# Patient Record
Sex: Male | Born: 1935 | Race: White | Hispanic: No
Health system: Southern US, Community
[De-identification: ages and names within clinical notes are randomized; demographics above are authoritative.]

---

## 2007-11-30 ENCOUNTER — Encounter: Admission: RE | Admit: 2007-11-30 | Discharge: 2007-11-30 | Payer: Self-pay | Admitting: Internal Medicine

## 2009-04-02 IMAGING — CT CT ABDOMEN WO/W CM
2 of 5 series · 16 of 46 positions shown, 18 images · IV contrast (agent unspecified)
Comparison: None

CT ABDOMEN

CLINICAL DATA: Mid abdominal pain for several months, history
prior trauma with laceration kidney and fracture pelvis

CT ABDOMEN WITHOUT AND WITH CONTRAST
CT PELVIS WITH CONTRAST
TECHNIQUE: Multidetector CT imaging of the abdomen was performed
initially following the standard protocol before administration of
intravenous contrast.  Multidetector CT imaging of the abdomen and
pelvis was then performed following the standard protocol during
the bolus injection of intravenous contrast.
Contrast: 100 ml Ymnipaque-4II

[Series 3: abdomen w/ · axial · 0.92mm/px · z∈[-504,-99]mm · 13 of 93 slices shown, 15 images]
[im 6/93  soft-tissue]
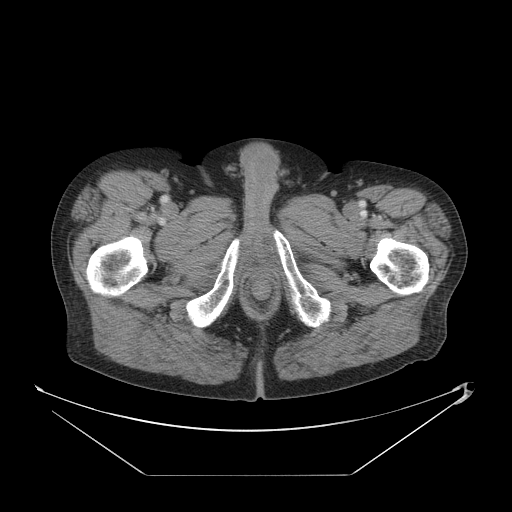
[im 6/93  bone]
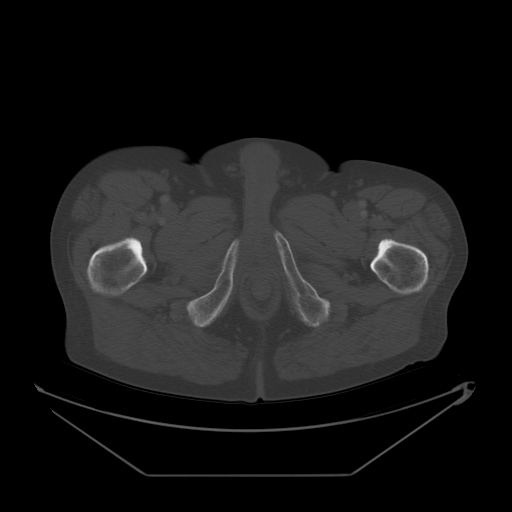
[im 12/93  soft-tissue]
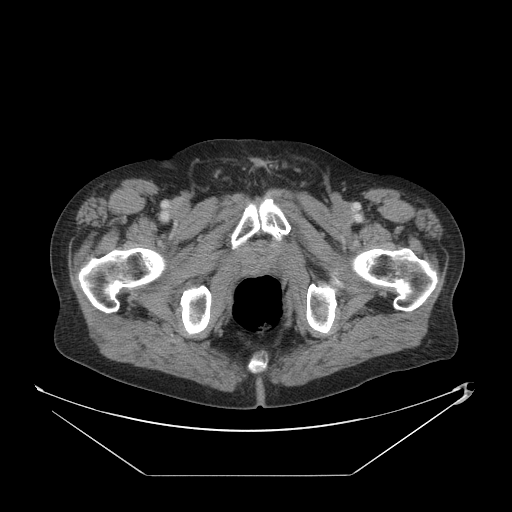
[im 18/93  soft-tissue]
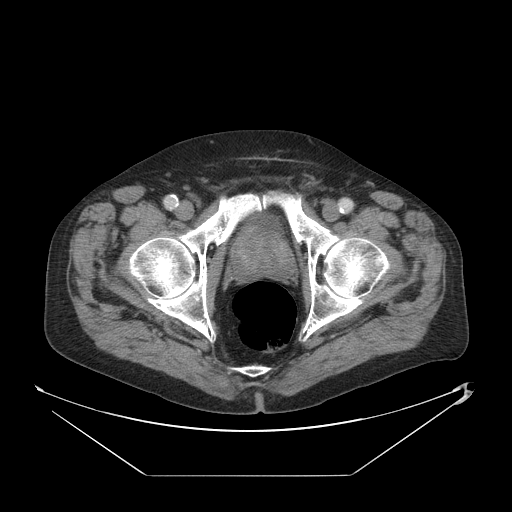
[im 29/93  soft-tissue]
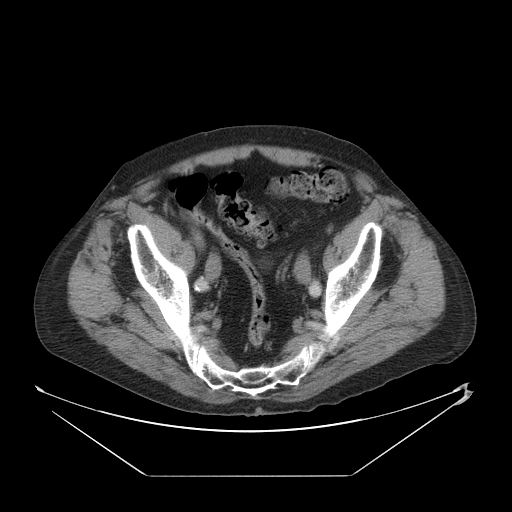
[im 35/93  soft-tissue]
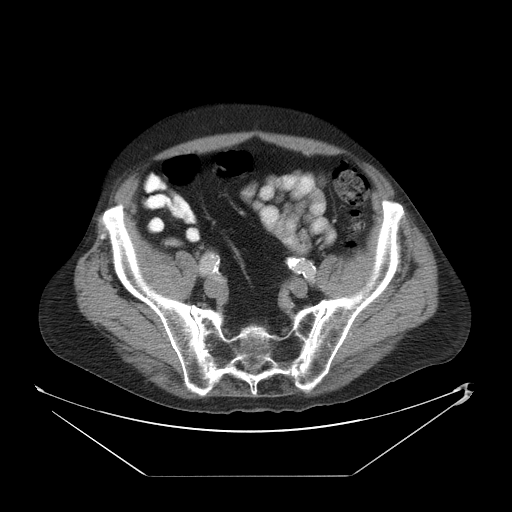
[im 41/93  soft-tissue]
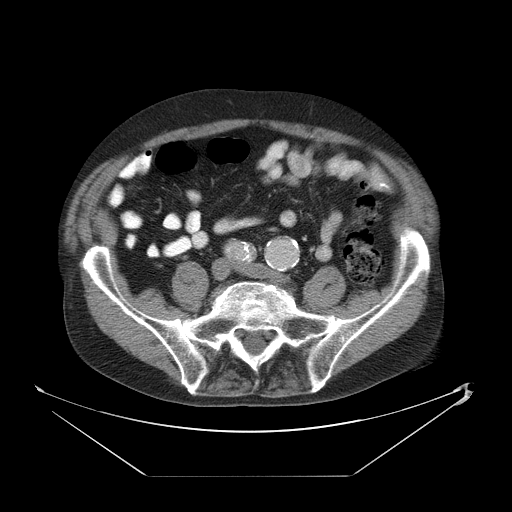
[im 47/93  soft-tissue]
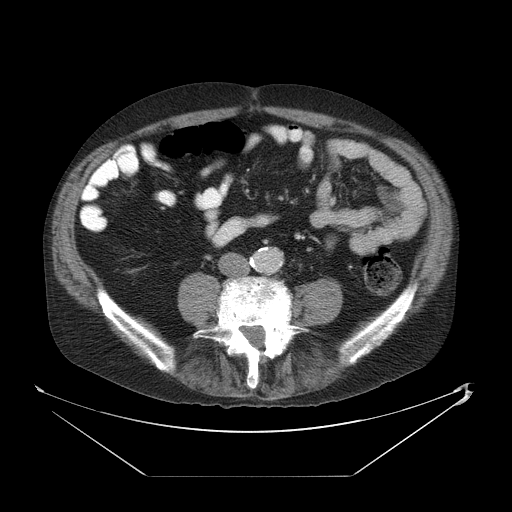
[im 52/93  soft-tissue]
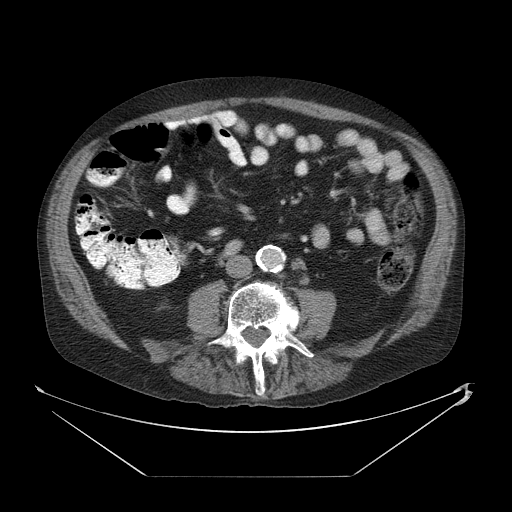
[im 58/93  soft-tissue]
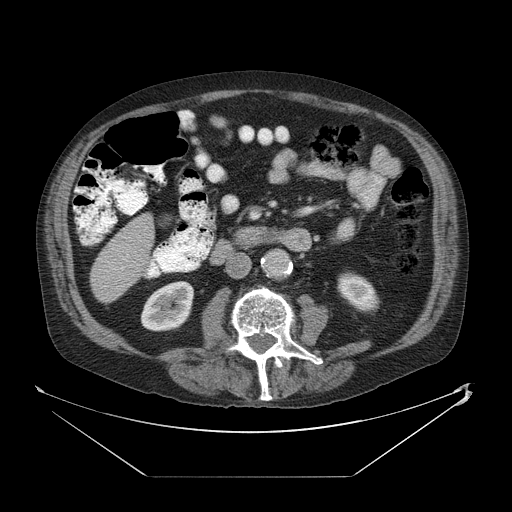
[im 58/93  bone]
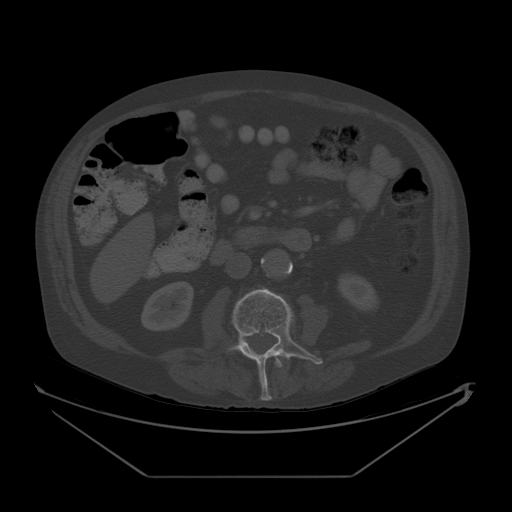
[im 64/93  soft-tissue]
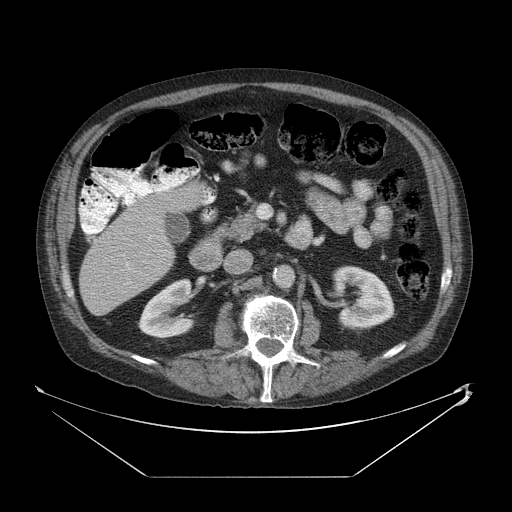
[im 75/93  soft-tissue]
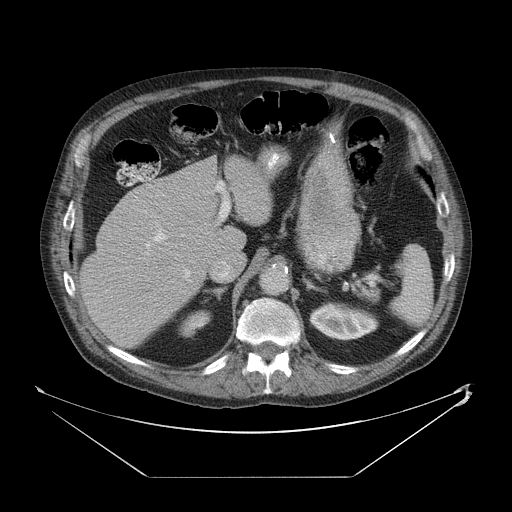
[im 81/93  soft-tissue]
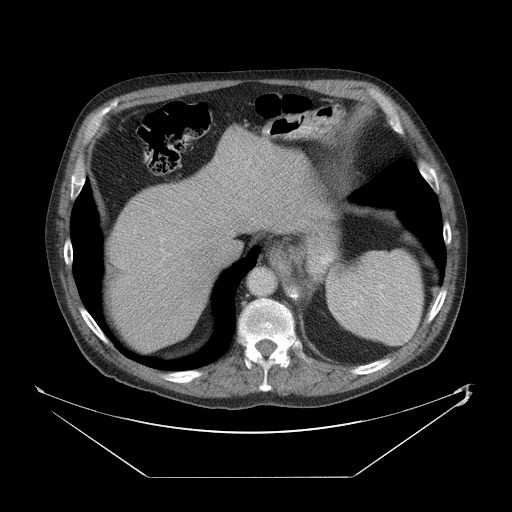
[im 87/93  soft-tissue]
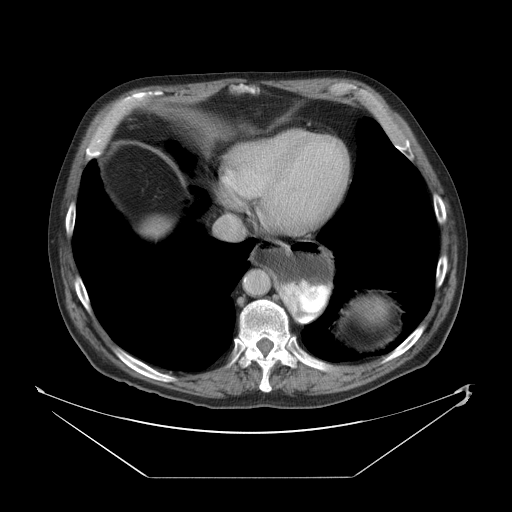

[Series 501: cor · coronal · 0.92mm/px · 3 of 128 slices shown]
[im 43/128  soft-tissue]
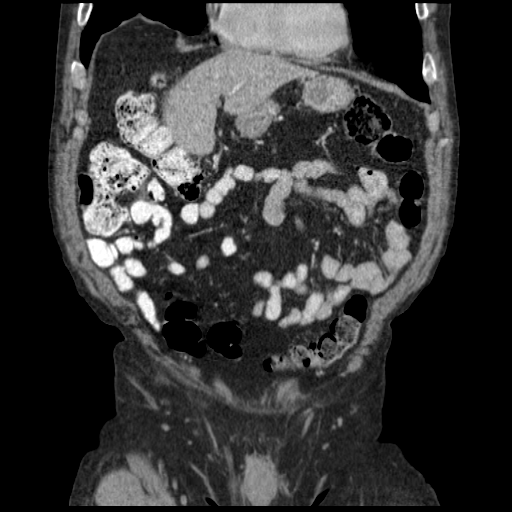
[im 57/128  soft-tissue]
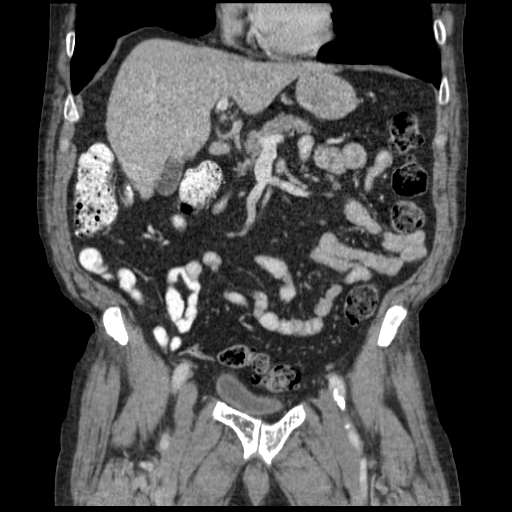
[im 71/128  soft-tissue]
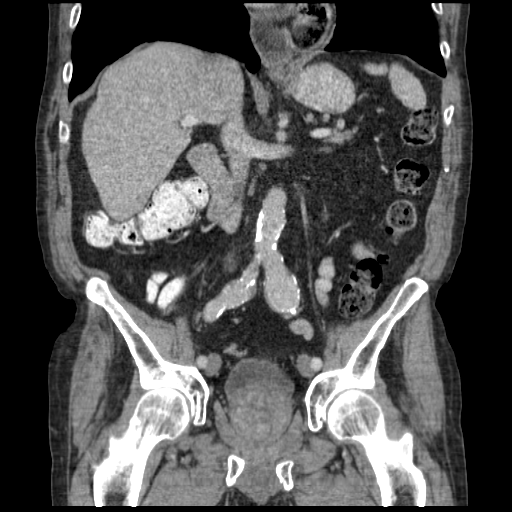

[16 of 46 positions shown; findings below may reference images not displayed]

FINDINGS: Emphysematous changes are noted at the lung bases.
Moderate sized hiatal hernia is present.  The liver is small with
no focal abnormality other than a single focal small focus of
calcification in the right lobe.  No calcified gallstones are seen.
The pancreas is normal in size and the pancreatic duct is not
dilated.  The adrenal glands and spleen appear normal.  Kidneys
appear normal in the unenhanced state.  No adenopathy is seen.

The liver enhances with no focal abnormality.  Kidneys enhance and
on delayed images the pelvocaliceal systems appear normal.
Atheromatous changes noted in the abdominal aorta.
IMPRESSION: 1. No significant abnormality on CT the abdomen.
 2.  Moderate sized hiatal hernia.
 3.  Emphysematous changes at the lung bases.

CT PELVIS
FINDINGS: There is aneurysmal dilatation of the proximal common
iliac arteries.  The left measures 31 mm in maximum diameter
proximally with the right measuring 20 mm.  Urinary bladder is
nondistended and difficult to assess.  The prostate is moderately
enlarged measuring 47 x 55 mm in its largest axial plane.
Rectosigmoid colonic diverticula are noted.  The terminal ileum
appears normal, as does the appendix.  Degenerative disc disease is
present throughout the lumbar spine.
IMPRESSION: 1.  Aneurysmal dilatation of the proximal common iliac arteries,
left greater than right as noted above.
2.  Moderately enlarged prostate.

3.  Rectosigmoid colonic diverticula.
4.  Appendix and terminal ileum appear normal.

## 2013-09-16 ENCOUNTER — Other Ambulatory Visit: Payer: Self-pay | Admitting: Interventional Cardiology

## 2013-09-17 NOTE — Telephone Encounter (Signed)
Cannot refill this. Needs to go thru PCP.

## 2013-10-22 NOTE — Telephone Encounter (Signed)
This was not prescribed by me

## 2013-11-21 NOTE — Telephone Encounter (Signed)
This is not a prescription that I would be responsible for

## 2013-11-22 NOTE — Telephone Encounter (Signed)
should go to PCP

## 2018-07-21 DEATH — deceased
